# Patient Record
Sex: Female | Born: 1980 | Race: White | Marital: Married | Smoking: Never smoker
Health system: Southern US, Community
[De-identification: ages and names within clinical notes are randomized; demographics above are authoritative.]

## PROBLEM LIST (undated history)

## (undated) DIAGNOSIS — F53 Postpartum depression: Secondary | ICD-10-CM

## (undated) DIAGNOSIS — O99345 Other mental disorders complicating the puerperium: Secondary | ICD-10-CM

## (undated) HISTORY — PX: WISDOM TOOTH EXTRACTION: SHX21

## (undated) LAB — HM HPV COTESTING: HM HPV COTESTING: NEGATIVE

---

## 2013-02-08 DIAGNOSIS — O42919 Preterm premature rupture of membranes, unspecified as to length of time between rupture and onset of labor, unspecified trimester: Secondary | ICD-10-CM

## 2013-12-01 ENCOUNTER — Ambulatory Visit
Admission: RE | Admit: 2013-12-01 | Discharge: 2013-12-01 | Disposition: A | Discharge status: Home / Self Care | Payer: BC Managed Care – PPO | Source: Ambulatory Visit | Attending: Family Medicine | Admitting: Family Medicine

## 2013-12-01 ENCOUNTER — Other Ambulatory Visit: Payer: Self-pay | Admitting: Family Medicine

## 2013-12-01 DIAGNOSIS — K529 Noninfective gastroenteritis and colitis, unspecified: Secondary | ICD-10-CM

## 2014-04-26 LAB — OB RESULTS CONSOLE HEPATITIS B SURFACE ANTIGEN: Hepatitis B Surface Ag: NEGATIVE

## 2014-04-26 LAB — OB RESULTS CONSOLE HIV ANTIBODY (ROUTINE TESTING): HIV: NONREACTIVE

## 2014-04-26 LAB — OB RESULTS CONSOLE ABO/RH: RH Type: POSITIVE

## 2014-04-26 LAB — OB RESULTS CONSOLE RPR: RPR: NONREACTIVE

## 2014-04-26 LAB — OB RESULTS CONSOLE ANTIBODY SCREEN: Antibody Screen: NEGATIVE

## 2014-04-26 LAB — OB RESULTS CONSOLE RUBELLA ANTIBODY, IGM: Rubella: IMMUNE

## 2014-11-10 NOTE — L&D Delivery Note (Signed)
Delivery Note At  a viable female was delivered via spontaneous vaginal delivery (Presentation:LOA ;  ).  APGAR:5/8 , ; weight  .   Placenta status:intact , .3 vessel;  Cord:  with the following complications:none2 .  Cord pH: pending  Anesthesia:  epidural Episiotomy:  none Lacerations:  none Suture Repair: na Est. Blood Loss (mL):  300  Mom to postpartum.  Baby to Couplet care / Skin to Skin.  Kellsie Grindle S 12/05/2014, 2:38 PM

## 2014-11-29 ENCOUNTER — Inpatient Hospital Stay (HOSPITAL_COMMUNITY)
Admission: AD | Admit: 2014-11-29 | Discharge: 2014-11-29 | Disposition: A | Discharge status: Home / Self Care | Payer: BLUE CROSS/BLUE SHIELD | Source: Ambulatory Visit | Attending: Obstetrics and Gynecology | Admitting: Obstetrics and Gynecology

## 2014-11-29 ENCOUNTER — Encounter (HOSPITAL_COMMUNITY): Payer: Self-pay

## 2014-11-29 DIAGNOSIS — O471 False labor at or after 37 completed weeks of gestation: Secondary | ICD-10-CM | POA: Diagnosis present

## 2014-11-29 DIAGNOSIS — Z3A38 38 weeks gestation of pregnancy: Secondary | ICD-10-CM | POA: Insufficient documentation

## 2014-11-29 NOTE — MAU Note (Signed)
Ctx every 3-7 minutes tonight. Denies LOF or vaginal bleeding. Positive fetal movement. SVE 2.75cm/60 last week per Dr. Renaldo FiddlerAdkins.

## 2014-11-29 NOTE — MAU Note (Signed)
PT SAYS  HURTING  SINCE   6PM.  VE LAST WEEK     DR ADKINS -  2.75 CM      DENIES HSV AND MRSA.        GBS-  POSITIVE.

## 2014-11-30 ENCOUNTER — Inpatient Hospital Stay (HOSPITAL_COMMUNITY)
Admission: AD | Admit: 2014-11-30 | Payer: BLUE CROSS/BLUE SHIELD | Source: Ambulatory Visit | Admitting: Obstetrics and Gynecology

## 2014-12-04 ENCOUNTER — Telehealth (HOSPITAL_COMMUNITY): Payer: Self-pay | Admitting: *Deleted

## 2014-12-04 ENCOUNTER — Encounter (HOSPITAL_COMMUNITY): Payer: Self-pay | Admitting: *Deleted

## 2014-12-04 LAB — OB RESULTS CONSOLE GBS: STREP GROUP B AG: POSITIVE

## 2014-12-04 NOTE — Telephone Encounter (Signed)
Preadmission screen  

## 2014-12-05 ENCOUNTER — Encounter (HOSPITAL_COMMUNITY): Payer: Self-pay

## 2014-12-05 ENCOUNTER — Inpatient Hospital Stay (HOSPITAL_COMMUNITY)
Admission: RE | Admit: 2014-12-05 | Discharge: 2014-12-07 | DRG: 775 | Disposition: A | Discharge status: Home / Self Care | Payer: BLUE CROSS/BLUE SHIELD | Source: Ambulatory Visit | Attending: Obstetrics and Gynecology | Admitting: Obstetrics and Gynecology

## 2014-12-05 ENCOUNTER — Inpatient Hospital Stay (HOSPITAL_COMMUNITY): Payer: BLUE CROSS/BLUE SHIELD | Admitting: Anesthesiology

## 2014-12-05 DIAGNOSIS — Z3490 Encounter for supervision of normal pregnancy, unspecified, unspecified trimester: Secondary | ICD-10-CM

## 2014-12-05 DIAGNOSIS — O99824 Streptococcus B carrier state complicating childbirth: Secondary | ICD-10-CM | POA: Diagnosis present

## 2014-12-05 DIAGNOSIS — Z3A39 39 weeks gestation of pregnancy: Secondary | ICD-10-CM | POA: Diagnosis present

## 2014-12-05 DIAGNOSIS — Z3403 Encounter for supervision of normal first pregnancy, third trimester: Secondary | ICD-10-CM | POA: Diagnosis present

## 2014-12-05 HISTORY — DX: Other mental disorders complicating the puerperium: O99.345

## 2014-12-05 HISTORY — DX: Postpartum depression: F53.0

## 2014-12-05 LAB — CBC
HCT: 37.9 % (ref 36.0–46.0)
Hemoglobin: 13.3 g/dL (ref 12.0–15.0)
MCH: 31.8 pg (ref 26.0–34.0)
MCHC: 35.1 g/dL (ref 30.0–36.0)
MCV: 90.7 fL (ref 78.0–100.0)
Platelets: 299 10*3/uL (ref 150–400)
RBC: 4.18 MIL/uL (ref 3.87–5.11)
RDW: 13 % (ref 11.5–15.5)
WBC: 9.4 10*3/uL (ref 4.0–10.5)

## 2014-12-05 LAB — TYPE AND SCREEN
ABO/RH(D): O POS
ANTIBODY SCREEN: NEGATIVE

## 2014-12-05 LAB — ABO/RH: ABO/RH(D): O POS

## 2014-12-05 MED ORDER — TERBUTALINE SULFATE 1 MG/ML IJ SOLN
0.2500 mg | Freq: Once | INTRAMUSCULAR | Status: DC | PRN
  Filled 2014-12-05: qty 1

## 2014-12-05 MED ORDER — LANOLIN HYDROUS EX OINT: TOPICAL_OINTMENT | CUTANEOUS | Status: DC | PRN

## 2014-12-05 MED ORDER — LACTATED RINGERS IV SOLN
INTRAVENOUS | Status: DC
  Administered 2014-12-05 (×2): via INTRAVENOUS

## 2014-12-05 MED ORDER — OXYCODONE-ACETAMINOPHEN 5-325 MG PO TABS: 2.0000 | ORAL_TABLET | ORAL | Status: DC | PRN

## 2014-12-05 MED ORDER — FLEET ENEMA 7-19 GM/118ML RE ENEM: 1.0000 | ENEMA | RECTAL | Status: DC | PRN

## 2014-12-05 MED ORDER — PRENATAL MULTIVITAMIN CH
1.0000 | ORAL_TABLET | Freq: Every day | ORAL | Status: DC
Start: 2014-12-06 — End: 2014-12-07
  Administered 2014-12-06: 1 via ORAL
  Filled 2014-12-05: qty 1

## 2014-12-05 MED ORDER — OXYCODONE-ACETAMINOPHEN 5-325 MG PO TABS: 1.0000 | ORAL_TABLET | ORAL | Status: DC | PRN

## 2014-12-05 MED ORDER — FENTANYL 2.5 MCG/ML BUPIVACAINE 1/10 % EPIDURAL INFUSION (WH - ANES)
INTRAMUSCULAR | Status: DC | PRN
  Administered 2014-12-05: 14 mL/h via EPIDURAL

## 2014-12-05 MED ORDER — IBUPROFEN 600 MG PO TABS
600.0000 mg | ORAL_TABLET | Freq: Four times a day (QID) | ORAL | Status: DC
  Administered 2014-12-05 – 2014-12-07 (×7): 600 mg via ORAL
  Filled 2014-12-05 (×7): qty 1

## 2014-12-05 MED ORDER — DIPHENHYDRAMINE HCL 50 MG/ML IJ SOLN: 12.5000 mg | INTRAMUSCULAR | Status: DC | PRN

## 2014-12-05 MED ORDER — OXYTOCIN 40 UNITS IN LACTATED RINGERS INFUSION - SIMPLE MED
62.5000 mL/h | INTRAVENOUS | Status: DC
  Filled 2014-12-05: qty 1000

## 2014-12-05 MED ORDER — SIMETHICONE 80 MG PO CHEW: 80.0000 mg | CHEWABLE_TABLET | ORAL | Status: DC | PRN

## 2014-12-05 MED ORDER — SODIUM CHLORIDE 0.9 % IV SOLN
2.0000 g | Freq: Four times a day (QID) | INTRAVENOUS | Status: DC
  Administered 2014-12-05: 2 g via INTRAVENOUS
  Filled 2014-12-05 (×3): qty 2000

## 2014-12-05 MED ORDER — BENZOCAINE-MENTHOL 20-0.5 % EX AERO: 1.0000 "application " | INHALATION_SPRAY | CUTANEOUS | Status: DC | PRN

## 2014-12-05 MED ORDER — WITCH HAZEL-GLYCERIN EX PADS: 1.0000 "application " | MEDICATED_PAD | CUTANEOUS | Status: DC | PRN

## 2014-12-05 MED ORDER — EPHEDRINE 5 MG/ML INJ
10.0000 mg | INTRAVENOUS | Status: DC | PRN
  Filled 2014-12-05: qty 2

## 2014-12-05 MED ORDER — LACTATED RINGERS IV SOLN: 500.0000 mL | INTRAVENOUS | Status: DC | PRN

## 2014-12-05 MED ORDER — ACETAMINOPHEN 325 MG PO TABS: 650.0000 mg | ORAL_TABLET | ORAL | Status: DC | PRN

## 2014-12-05 MED ORDER — DIPHENHYDRAMINE HCL 25 MG PO CAPS: 25.0000 mg | ORAL_CAPSULE | Freq: Four times a day (QID) | ORAL | Status: DC | PRN

## 2014-12-05 MED ORDER — LACTATED RINGERS IV SOLN
500.0000 mL | Freq: Once | INTRAVENOUS | Status: AC
  Administered 2014-12-05: 500 mL via INTRAVENOUS

## 2014-12-05 MED ORDER — ONDANSETRON HCL 4 MG/2ML IJ SOLN: 4.0000 mg | Freq: Four times a day (QID) | INTRAMUSCULAR | Status: DC | PRN

## 2014-12-05 MED ORDER — LIDOCAINE HCL (PF) 1 % IJ SOLN
30.0000 mL | INTRAMUSCULAR | Status: DC | PRN
  Filled 2014-12-05: qty 30

## 2014-12-05 MED ORDER — FENTANYL 2.5 MCG/ML BUPIVACAINE 1/10 % EPIDURAL INFUSION (WH - ANES)
14.0000 mL/h | INTRAMUSCULAR | Status: DC | PRN
  Administered 2014-12-05: 14 mL/h via EPIDURAL
  Filled 2014-12-05: qty 125

## 2014-12-05 MED ORDER — ZOLPIDEM TARTRATE 5 MG PO TABS: 5.0000 mg | ORAL_TABLET | Freq: Every evening | ORAL | Status: DC | PRN

## 2014-12-05 MED ORDER — OXYTOCIN 40 UNITS IN LACTATED RINGERS INFUSION - SIMPLE MED
1.0000 m[IU]/min | INTRAVENOUS | Status: DC
  Administered 2014-12-05: 2 m[IU]/min via INTRAVENOUS

## 2014-12-05 MED ORDER — LIDOCAINE HCL (PF) 1 % IJ SOLN
INTRAMUSCULAR | Status: DC | PRN
  Administered 2014-12-05 (×2): 4 mL

## 2014-12-05 MED ORDER — OXYCODONE-ACETAMINOPHEN 5-325 MG PO TABS
1.0000 | ORAL_TABLET | ORAL | Status: DC | PRN
  Administered 2014-12-06: 1 via ORAL
  Filled 2014-12-05: qty 1

## 2014-12-05 MED ORDER — PHENYLEPHRINE 40 MCG/ML (10ML) SYRINGE FOR IV PUSH (FOR BLOOD PRESSURE SUPPORT)
80.0000 ug | PREFILLED_SYRINGE | INTRAVENOUS | Status: DC | PRN
  Filled 2014-12-05: qty 2
  Filled 2014-12-05: qty 20

## 2014-12-05 MED ORDER — ONDANSETRON HCL 4 MG/2ML IJ SOLN: 4.0000 mg | INTRAMUSCULAR | Status: DC | PRN

## 2014-12-05 MED ORDER — SENNOSIDES-DOCUSATE SODIUM 8.6-50 MG PO TABS
2.0000 | ORAL_TABLET | ORAL | Status: DC
Start: 2014-12-06 — End: 2014-12-07
  Administered 2014-12-06: 2 via ORAL
  Filled 2014-12-05 (×2): qty 2

## 2014-12-05 MED ORDER — BISACODYL 10 MG RE SUPP: 10.0000 mg | Freq: Every day | RECTAL | Status: DC | PRN

## 2014-12-05 MED ORDER — ONDANSETRON HCL 4 MG PO TABS: 4.0000 mg | ORAL_TABLET | ORAL | Status: DC | PRN

## 2014-12-05 MED ORDER — OXYTOCIN BOLUS FROM INFUSION
500.0000 mL | INTRAVENOUS | Status: DC
  Administered 2014-12-05: 500 mL via INTRAVENOUS

## 2014-12-05 MED ORDER — PHENYLEPHRINE 40 MCG/ML (10ML) SYRINGE FOR IV PUSH (FOR BLOOD PRESSURE SUPPORT)
80.0000 ug | PREFILLED_SYRINGE | INTRAVENOUS | Status: DC | PRN
  Filled 2014-12-05: qty 2

## 2014-12-05 MED ORDER — SODIUM CHLORIDE 0.9 % IV SOLN
2.0000 g | Freq: Once | INTRAVENOUS | Status: AC
  Administered 2014-12-05: 2 g via INTRAVENOUS
  Filled 2014-12-05: qty 2000

## 2014-12-05 MED ORDER — TETANUS-DIPHTH-ACELL PERTUSSIS 5-2.5-18.5 LF-MCG/0.5 IM SUSP
0.5000 mL | Freq: Once | INTRAMUSCULAR | Status: AC
  Administered 2014-12-07: 0.5 mL via INTRAMUSCULAR
  Filled 2014-12-05: qty 0.5

## 2014-12-05 MED ORDER — CITRIC ACID-SODIUM CITRATE 334-500 MG/5ML PO SOLN: 30.0000 mL | ORAL | Status: DC | PRN

## 2014-12-05 MED ORDER — DIBUCAINE 1 % RE OINT: 1.0000 "application " | TOPICAL_OINTMENT | RECTAL | Status: DC | PRN

## 2014-12-05 MED ORDER — FLEET ENEMA 7-19 GM/118ML RE ENEM: 1.0000 | ENEMA | Freq: Every day | RECTAL | Status: DC | PRN

## 2014-12-05 NOTE — Lactation Note (Signed)
This note was copied from the chart of [name]. Lactation Consultation Note  Patient Name: [name] BJYNW'GToday's Date: 12/05/2014 Reason for consult: Initial assessment of this mom and baby at 8 hours pp.  This is mom's second child.  Her first child was born at 2436 weeks and weight >6 lbs but breastfeeding was a challenge in the beginning, per mom.  She was able to breastfeed her for 14 months and states that her newborn has latched well to both breasts.  Mom encouraged to place baby STS frequently and cue feed.  LC reviewed reason for hand expression and mom says she knows how to express by hand as needed.  Baby is not cuing and is asleep and STS after first bath.  Initial LATCH score=9 per RN assessment.LC encouraged review of Baby and Me pp 9, 14 and 20-25 for STS and BF information. LC provided [facility] brochure and reviewed [facility] services and list of community and web site resources.     Maternal Data Formula Feeding for Exclusion: No Has patient been taught Hand Expression?: Yes Does the patient have breastfeeding experience prior to this delivery?: Yes  Feeding Feeding Type: Breast Fed Length of feed: 5 min  LATCH Score/Interventions Latch: Grasps breast easily, tongue down, lips flanged, rhythmical sucking.  Audible Swallowing: A few with stimulation Intervention(s): Skin to skin  Type of Nipple: Everted at rest and after stimulation  Comfort (Breast/Nipple): Soft / non-tender     Hold (Positioning): No assistance needed to correctly position infant at breast.  LATCH Score: 9 (previous feeding assessment, per RN)  Lactation Tools Discussed/Used   STS, hand expression, cue feedings  Consult Status Consult Status: Follow-up Date: 12/06/14 Follow-up type: In-patient    [name], [name] [facility] 12/05/2014, 10:32 PM

## 2014-12-05 NOTE — Anesthesia Preprocedure Evaluation (Signed)
Anesthesia Evaluation  Patient identified by MRN, date of birth, ID band Patient awake    Reviewed: Allergy & Precautions, NPO status , Patient's Chart, lab work & pertinent test results  History of Anesthesia Complications Negative for: history of anesthetic complications  Airway Mallampati: II  TM Distance: >3 FB Neck ROM: Full    Dental no notable dental hx. (+) Dental Advisory Given   Pulmonary neg pulmonary ROS,  breath sounds clear to auscultation  Pulmonary exam normal       Cardiovascular negative cardio ROS  Rhythm:Regular Rate:Normal     Neuro/Psych PSYCHIATRIC DISORDERS Depression negative neurological ROS     GI/Hepatic negative GI ROS, Neg liver ROS,   Endo/Other  negative endocrine ROS  Renal/GU negative Renal ROS  negative genitourinary   Musculoskeletal negative musculoskeletal ROS (+)   Abdominal   Peds negative pediatric ROS (+)  Hematology negative hematology ROS (+)   Anesthesia Other Findings   Reproductive/Obstetrics (+) Pregnancy                             Anesthesia Physical Anesthesia Plan  ASA: II  Anesthesia Plan: Epidural   Post-op Pain Management:    Induction:   Airway Management Planned:   Additional Equipment:   Intra-op Plan:   Post-operative Plan:   Informed Consent: I have reviewed the patients History and Physical, chart, labs and discussed the procedure including the risks, benefits and alternatives for the proposed anesthesia with the patient or authorized representative who has indicated his/her understanding and acceptance.   Dental advisory given  Plan Discussed with:   Anesthesia Plan Comments:         Anesthesia Quick Evaluation

## 2014-12-05 NOTE — Progress Notes (Signed)
Infant delivered without complication at 1426.  When infant did not begin to cry with stimulation per md, cord was cut and baby taken to warmer.  Infant rr and hr wnl and color was improving but poor tone continued.  Pulse ox applied at approx 4min of age and o2 was 75%. Oxygenation improved quickly and by 8min of age, 62o2 was 94% Infant given back to mom.  Within 5min of being skin to skin infant began to grunt and dropped his sats back into the 70s.  Infant taken back to the warmer and nursery called to come and assess.

## 2014-12-05 NOTE — Anesthesia Procedure Notes (Signed)
Epidural Patient location during procedure: OB Start time: 12/05/2014 10:20 AM End time: 12/05/2014 10:26 AM  Staffing Anesthesiologist: Felipe DroneJUDD, Jaidev Sanger JENNETTE Performed by: anesthesiologist   Preanesthetic Checklist Completed: patient identified, site marked, surgical consent, pre-op evaluation, timeout performed, IV checked, risks and benefits discussed and monitors and equipment checked  Epidural Patient position: sitting Prep: site prepped and draped and DuraPrep Patient monitoring: continuous pulse ox and blood pressure Approach: midline Location: L3-L4 Injection technique: LOR saline  Needle:  Needle type: Tuohy  Needle gauge: 17 G Needle length: 9 cm and 9 Needle insertion depth: 5 cm cm Catheter type: closed end flexible Catheter size: 19 Gauge Catheter at skin depth: 10 cm Test dose: negative  Assessment Events: blood not aspirated, injection not painful, no injection resistance, negative IV test and no paresthesia  Additional Notes Patient identified. Risks/Benefits/Options discussed with patient including but not limited to bleeding, infection, nerve damage, paralysis, failed block, incomplete pain control, headache, blood pressure changes, nausea, vomiting, reactions to medication both or allergic, itching and postpartum back pain. Confirmed with bedside nurse the patient's most recent platelet count. Confirmed with patient that they are not currently taking any anticoagulation, have any bleeding history or any family history of bleeding disorders. Patient expressed understanding and wished to proceed. All questions were answered. Sterile technique was used throughout the entire procedure. Please see nursing notes for vital signs. Test dose was given through epidural catheter and negative prior to continuing to dose epidural or start infusion. Warning signs of high block given to the patient including shortness of breath, tingling/numbness in hands, complete motor block, or  any concerning symptoms with instructions to call for help. Patient was given instructions on fall risk and not to get out of bed. All questions and concerns addressed with instructions to call with any issues or inadequate analgesia.

## 2014-12-05 NOTE — H&P (Signed)
Morgan LabradorMolly Welch is a 34 y.o. female presenting at 5939 weeks with favorable cervix for induction by AROM.  Positive GBS. Maternal Medical History:  Fetal activity: Perceived fetal activity is normal.    Prenatal complications: no prenatal complications Prenatal Complications - Diabetes: none.    OB History    Gravida Para Term Preterm AB TAB SAB Ectopic Multiple Living   2 1  1      1      No past medical history on file. Past Surgical History  Procedure Laterality Date  . Wisdom tooth extraction     Family History: family history is not on file. Social History:  reports that she has never smoked. She does not have any smokeless tobacco history on file. She reports that she does not drink alcohol or use illicit drugs.   Prenatal Transfer Tool  Maternal Diabetes: No Genetic Screening: Declined Maternal Ultrasounds/Referrals: Normal Fetal Ultrasounds or other Referrals:  None Maternal Substance Abuse:  No Significant Maternal Medications:  None Significant Maternal Lab Results:  Lab values include: Group B Strep positive Other Comments:  None  ROS    Height 5\' 2"  (1.575 m), weight 83.008 kg (183 lb), not currently breastfeeding. Maternal Exam:  Uterine Assessment: Contraction strength is mild.  Contraction frequency is irregular.   Abdomen: Patient reports no abdominal tenderness. Fundal height is c/w dates.   Estimated fetal weight is 8.   Fetal presentation: vertex  Pelvis: adequate for delivery.   Cervix: 3 cm 50 %  Fetal Exam Fetal State Assessment: Category I - tracings are normal.     Physical Exam  Prenatal labs: ABO, Rh: O/Positive/-- (06/17 0000) Antibody: Negative (06/17 0000) Rubella: Immune (06/17 0000) RPR: Nonreactive (06/17 0000)  HBsAg: Negative (06/17 0000)  HIV: Non-reactive (06/17 0000)  GBS: Positive (01/25 0000)   Assessment/Plan: IUP at 39 weeks with favorable cervix for induction Positive GBS IV ampicillin induction by  AROM   Morgan Welch S 12/05/2014, 7:53 AM

## 2014-12-06 LAB — CBC
HCT: 33.6 % — ABNORMAL LOW (ref 36.0–46.0)
Hemoglobin: 11.5 g/dL — ABNORMAL LOW (ref 12.0–15.0)
MCH: 31.3 pg (ref 26.0–34.0)
MCHC: 34.2 g/dL (ref 30.0–36.0)
MCV: 91.3 fL (ref 78.0–100.0)
PLATELETS: 234 10*3/uL (ref 150–400)
RBC: 3.68 MIL/uL — ABNORMAL LOW (ref 3.87–5.11)
RDW: 13 % (ref 11.5–15.5)
WBC: 12.2 10*3/uL — ABNORMAL HIGH (ref 4.0–10.5)

## 2014-12-06 LAB — RPR: RPR Ser Ql: NONREACTIVE

## 2014-12-06 NOTE — Progress Notes (Signed)
Post Partum Day 1 Subjective: no complaints, up ad lib, voiding, tolerating PO and + flatus  Objective: Blood pressure 109/64, pulse 66, temperature 98 F (36.7 C), temperature source Oral, resp. rate 20, height 5\' 2"  (1.575 m), weight 183 lb (83.008 kg), SpO2 98 %, unknown if currently breastfeeding.  Physical Exam:  General: alert and cooperative Lochia: appropriate Uterine Fundus: firm Incision: perineum intact DVT Evaluation: No evidence of DVT seen on physical exam. Negative Homan's sign. No cords or calf tenderness. No significant calf/ankle edema.   Recent Labs  12/05/14 0755 12/06/14 0540  HGB 13.3 11.5*  HCT 37.9 33.6*    Assessment/Plan: Plan for discharge tomorrow and Circumcision prior to discharge   LOS: 1 day   Nitin Mckowen G 12/06/2014, 8:18 AM

## 2014-12-06 NOTE — Anesthesia Postprocedure Evaluation (Signed)
Anesthesia Post Note  Patient: Morgan LabradorMolly Klostermann  Procedure(s) Performed: * No procedures listed *  Anesthesia type: Epidural  Patient location: Mother/Baby  Post pain: Pain level controlled  Post assessment: Post-op Vital signs reviewed  Last Vitals:  Filed Vitals:   12/06/14 0629  BP: 109/64  Pulse: 66  Temp: 36.7 C  Resp:     Post vital signs: Reviewed  Level of consciousness:alert  Complications: No apparent anesthesia complications

## 2014-12-06 NOTE — Anesthesia Postprocedure Evaluation (Signed)
  Anesthesia Post-op Note  Patient: Morgan LabradorMolly Shaheen  Procedure(s) Performed: * No procedures listed *  Patient Location: Mother/Baby  Anesthesia Type:Epidural  Level of Consciousness: awake, alert  and oriented  Airway and Oxygen Therapy: Patient Spontanous Breathing  Post-op Pain: none  Post-op Assessment: Post-op Vital signs reviewed and Patient's Cardiovascular Status Stable  Post-op Vital Signs: Reviewed and stable  Last Vitals:  Filed Vitals:   12/06/14 0629  BP: 109/64  Pulse: 66  Temp: 36.7 C  Resp:     Complications: No apparent anesthesia complications

## 2014-12-07 MED ORDER — IBUPROFEN 600 MG PO TABS: 600.0000 mg | ORAL_TABLET | Freq: Four times a day (QID) | ORAL | Status: AC

## 2014-12-07 MED ORDER — OXYCODONE-ACETAMINOPHEN 5-325 MG PO TABS: 1.0000 | ORAL_TABLET | ORAL | Status: AC | PRN

## 2014-12-07 NOTE — Discharge Summary (Signed)
Obstetric Discharge Summary Reason for Admission: induction of labor Prenatal Procedures: ultrasound Intrapartum Procedures: spontaneous vaginal delivery Postpartum Procedures: none Complications-Operative and Postpartum: none HEMOGLOBIN  Date Value Ref Range Status  12/06/2014 11.5* 12.0 - 15.0 g/dL Final   HCT  Date Value Ref Range Status  12/06/2014 33.6* 36.0 - 46.0 % Final    Physical Exam:  General: alert and cooperative Lochia: appropriate Uterine Fundus: firm Incision: perineum intact DVT Evaluation: No evidence of DVT seen on physical exam. Negative Homan's sign. No cords or calf tenderness. No significant calf/ankle edema.  Discharge Diagnoses: Term Pregnancy-delivered  Discharge Information: Date: 12/07/2014 Activity: pelvic rest Diet: routine Medications: PNV, Ibuprofen and Percocet Condition: stable Instructions: refer to practice specific booklet Discharge to: home   Newborn Data: Live born female  Birth Weight: 8 lb 7.3 oz (3835 g) APGAR: 5, 8  Home with mother.  Morgan Welch G 12/07/2014, 8:24 AM

## 2014-12-07 NOTE — Lactation Note (Signed)
This note was copied from the chart of Morgan Illene LabradorMolly Jaworowski. Lactation Consultation Note: Follow up visit with mom. She reports that baby just finished feeding- he is asleep in her arms. Reports this breast feeding experience is so much different than her first. Pecola LeisureBaby is latching well with no pain. Asking about when to start pumping- reviewed with mom. No further questions at present. To call prn  Patient Name: Morgan Welch FAOZH'YToday's Date: 12/07/2014 Reason for consult: Follow-up assessment   Maternal Data Formula Feeding for Exclusion: No Has patient been taught Hand Expression?: Yes Does the patient have breastfeeding experience prior to this delivery?: Yes  Feeding    LATCH Score/Interventions                      Lactation Tools Discussed/Used     Consult Status Consult Status: Complete    Pamelia HoitWeeks, Miles Borkowski D 12/07/2014, 8:27 AM

## 2015-02-18 IMAGING — US US ABDOMEN COMPLETE
1 series · 14 of 25 positions shown · non-contrast
Comparison: None.

CLINICAL DATA: Chronic diarrhea

EXAM:
ULTRASOUND ABDOMEN COMPLETE

[Series 1: us abdomen complete · 0.24mm/px · 14 of 63 slices shown]
[im 1/63]
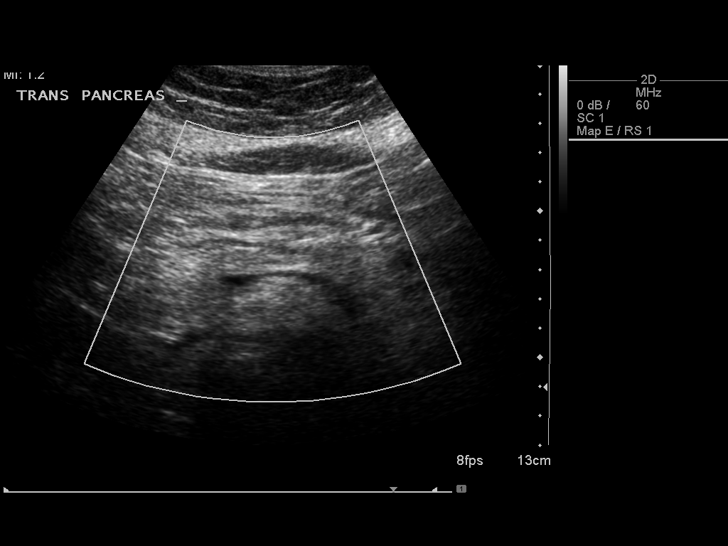
[im 6/63]
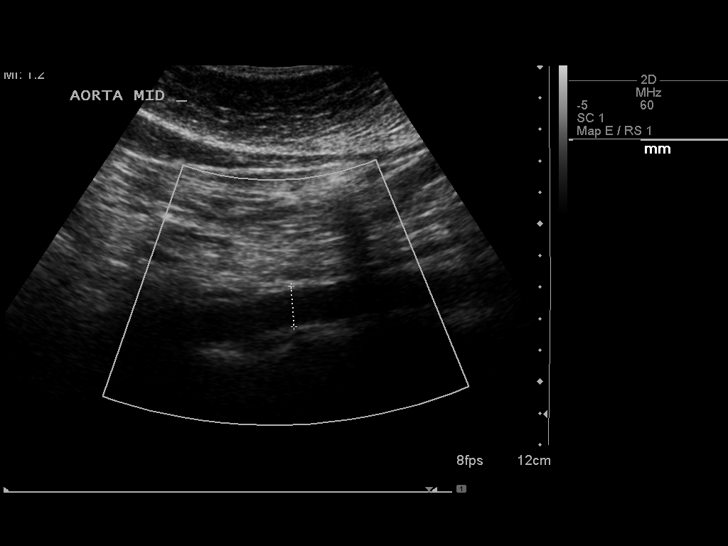
[im 11/63]
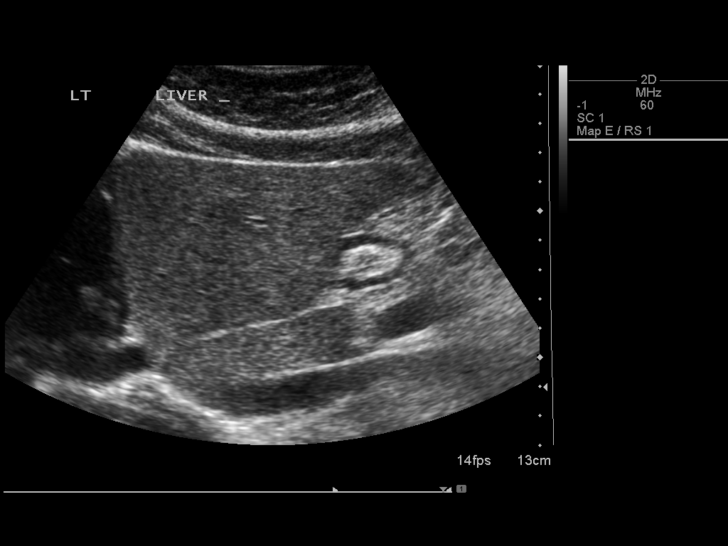
[im 16/63]
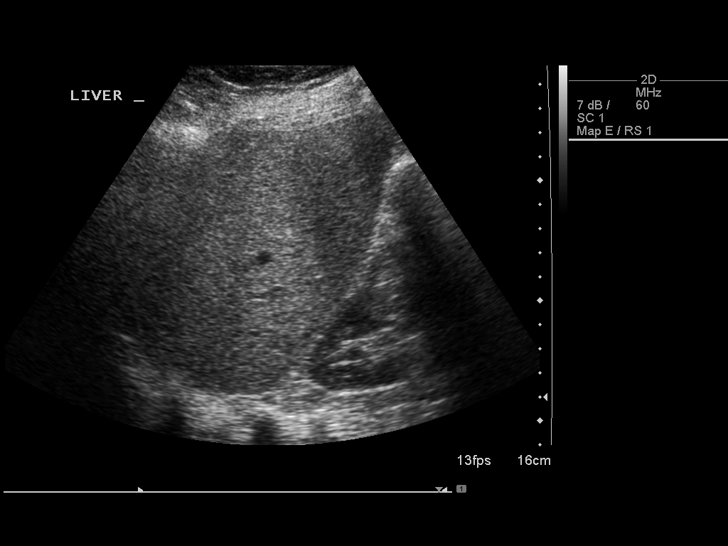
[im 21/63]
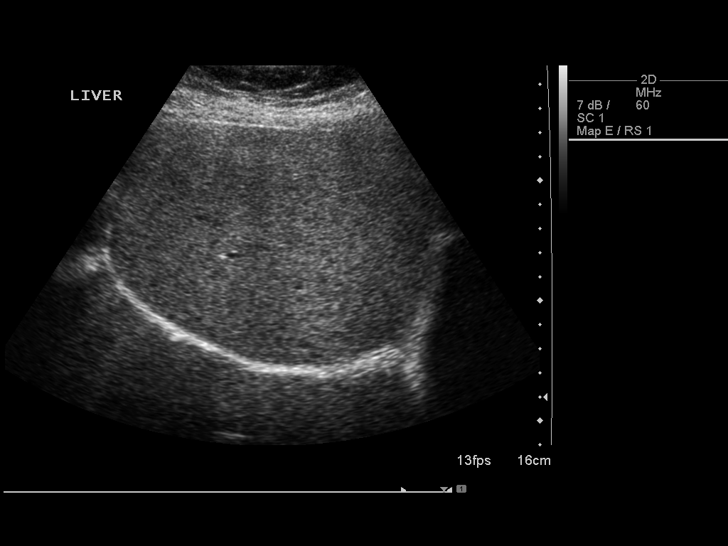
[im 24/63]
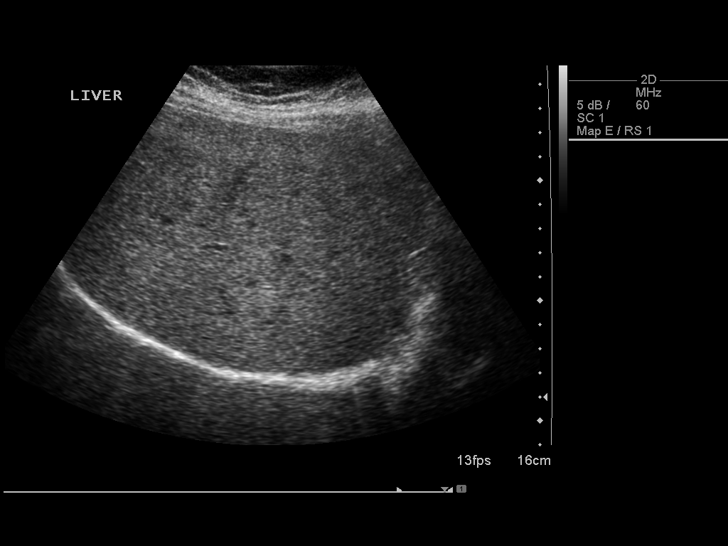
[im 29/63]
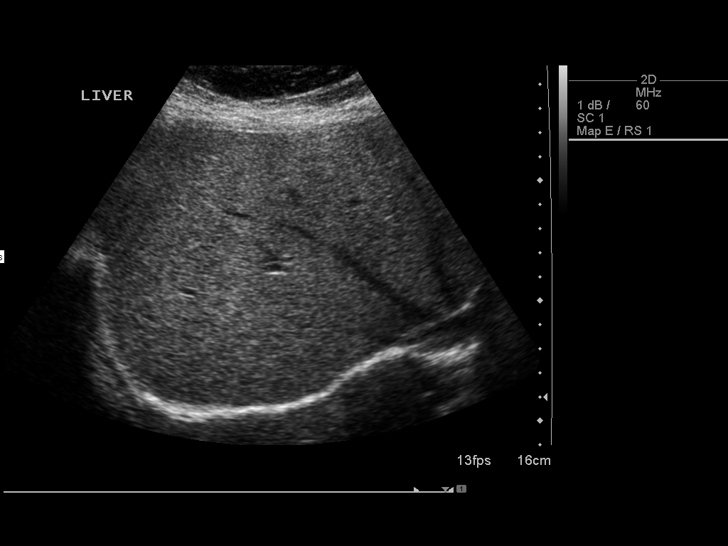
[im 34/63]
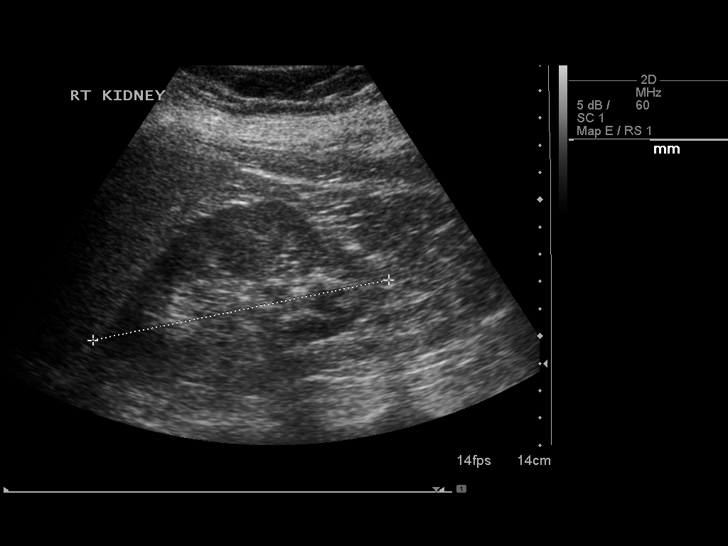
[im 39/63]
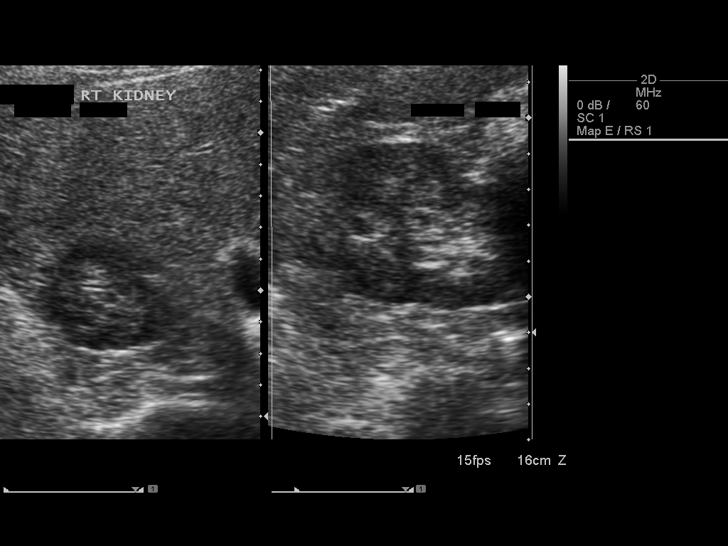
[im 42/63]
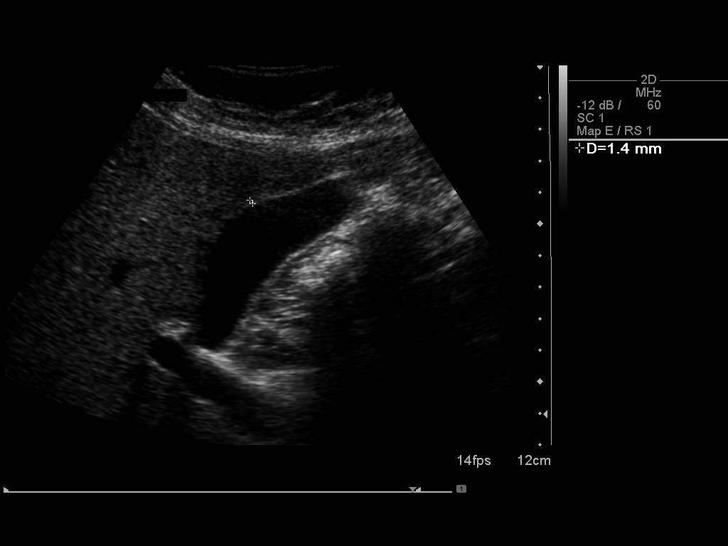
[im 47/63]
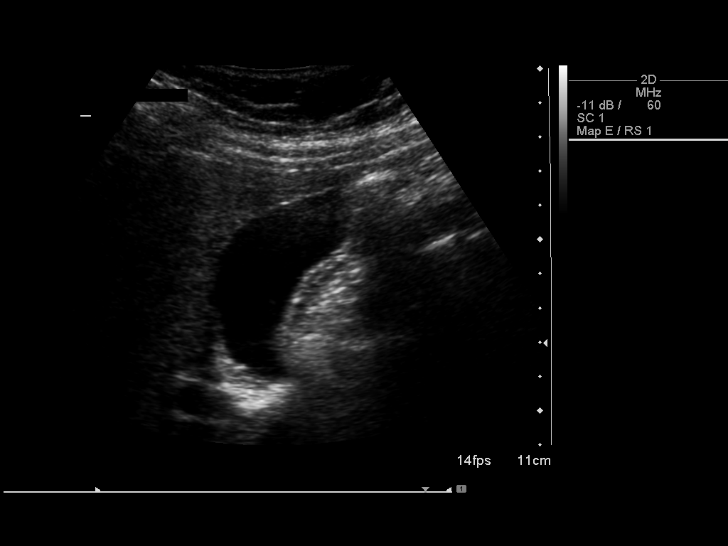
[im 52/63]
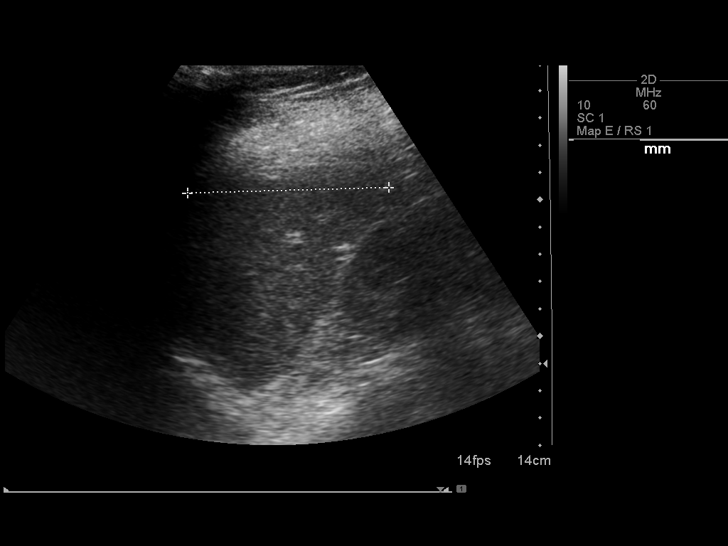
[im 57/63]
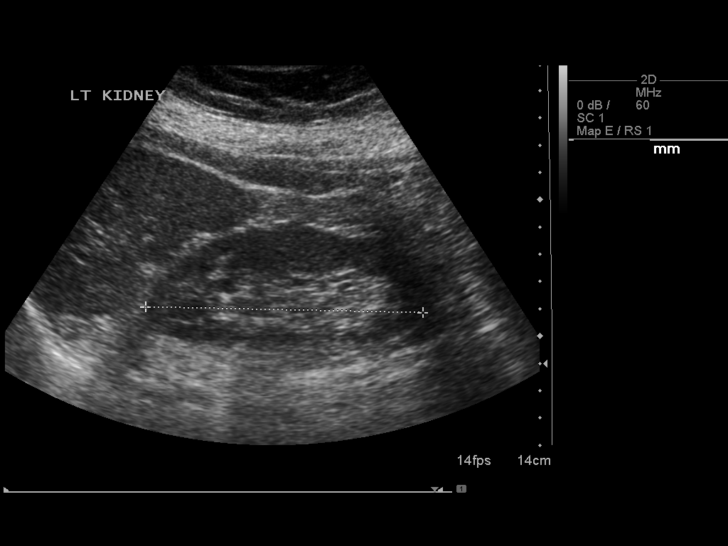
[im 63/63]
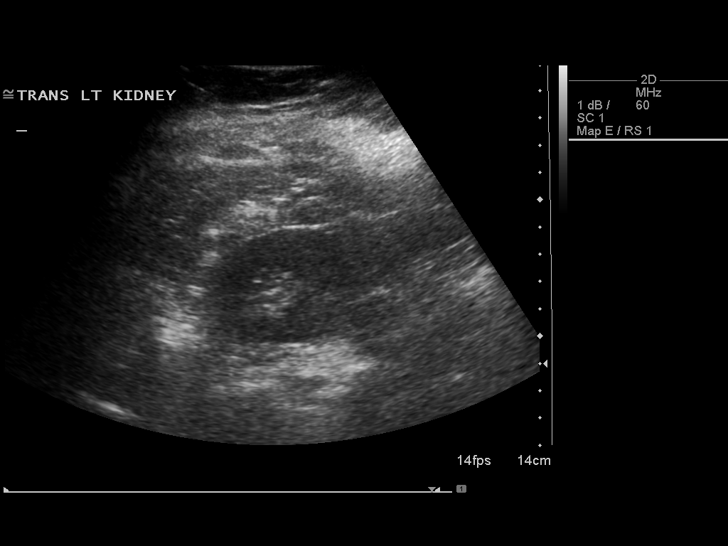

[14 of 25 positions shown; findings below may reference images not displayed]

FINDINGS: Gallbladder:

No gallstones, gallbladder wall thickening, or pericholecystic
fluid. Negative sonographic Murphy's sign.

Common bile duct:

Diameter: 3 mm.

Liver:

No focal lesion identified. Within the upper limits of normal for
parenchymal echogenicity.

IVC:

No abnormality visualized.

Pancreas:

Visualized portions are unremarkable.

Spleen:

Measures 7.4 cm.

Right Kidney:

Length: 11.1 cm.  No mass or hydronephrosis.

Left Kidney:

Length: 10.2 cm.  No mass or hydronephrosis.

Abdominal aorta:

No aneurysm visualized.

Other findings:

None.
IMPRESSION: Negative abdominal ultrasound.

## 2022-08-22 NOTE — Unmapped (Signed)
Ok to offer a new patient appointment (30 minutes) when there are available spots

## 2022-08-22 NOTE — Unmapped (Signed)
Pt requesting to see dr Christella Hartigan per referral of a friend that sees dr Christella Hartigan ([email or web address]). Informed of policy, insist on sending msg. Please advise

## 2022-08-25 NOTE — Unmapped (Signed)
Left voicemail for pt to call and schedule appointment

## 2022-08-26 NOTE — Unmapped (Signed)
Made apt for pt on nov 7th.  As a new pt per dr Christella Hartigan

## 2022-09-16 ENCOUNTER — Ambulatory Visit: Admit: 2022-09-16 | Discharge: 2022-09-16 | Payer: PRIVATE HEALTH INSURANCE

## 2022-09-16 DIAGNOSIS — Z0000 Encounter for general adult medical examination without abnormal findings: Secondary | ICD-10-CM

## 2022-09-16 MED ORDER — linaCLOtide (LINZESS) 145 mcg Cap
145 | ORAL_CAPSULE | Freq: Every day | ORAL | 5 refills | 30.00000 days | Status: AC
Start: 2022-09-16 — End: 2022-10-27

## 2022-09-16 NOTE — Unmapped (Signed)
Worse since cholecystectomy  Will trial linzess as pt has trialed many OTC meds

## 2022-09-16 NOTE — Unmapped (Signed)
[facility] OFFICE  [address]  [place] [address]  Subjective   Name:  Jasmine Fritz Date of Birth: Apr 29, 1981 (41 y.o.)   MRN: [id number]    Date of Service:  09/16/2022      Chief Complaint   Patient presents with    New Patient Visit/ Consultation     History of Present Illness:  Jasmine Fritz is a(n) 41 y.o. female here today for the following:   HPI    She is concerned her digestive system is not working well  Had her gallbladder removed in July 2022.  Since then she doesn't go to the restroom regularly, tried culturelle which worked for a little while but then it stopped working, tried Librarian, academic and another thing through [place]  She is also gaining a lot of weight.     Just moved here from [place]-   She is working at [employer]. Works as an Animator.   Things are worse, doesn't go regularly, when she does go it is kind of liquidy.     She is eating a healthy balance of foods.   Ground [place], steamed veggies, lots of fiber.   Drinks a lot of water.   She is exercising regularly, walks a lot , started some strength training to build up her muscle.     Had a hemithyroidectomy in 2019- gets her thyroid checked yearly due to this, was supposed to get it checked in July.     She had a PAP smear.     Well adult form completed regarding diet, exercise, safety, violence, sexual health and risk factors, health maintenance including breast health, pap smear, colonoscopy, dental, vision and hearing.   Reviewed with patient in detail during visit.   Alcohol misuse screening: performed, negative  Depression screening: performed, negative    Current Outpatient Medications   Medication Sig Dispense Refill    linaCLOtide (LINZESS) 145 mcg Cap Take 1 capsule (145 mcg total) by mouth daily. 30 capsule 5     No current facility-administered medications for this visit.      Review of Systems        Objective   Vitals:    09/16/22 1531   BP: 101/64    Pulse: 77   SpO2: 96%   Height: 5' 3 (1.6 m)   Weight: 156 lb 3.2 oz (70.9 kg)   BMI (Calculated): 27.68     Body mass index is 27.67 kg/m.        Physical Exam  Vitals and nursing note reviewed.   Constitutional:       General: She is not in acute distress.     Appearance: She is well-developed.   HENT:      Head: Normocephalic and atraumatic.      Right Ear: External ear normal.      Left Ear: External ear normal.      Nose: Nose normal.   Eyes:      General: No scleral icterus.     Conjunctiva/sclera: Conjunctivae normal.      Pupils: Pupils are equal, round, and reactive to light.   Neck:      Thyroid: No thyromegaly.   Cardiovascular:      Rate and Rhythm: Normal rate and regular rhythm.      Heart sounds: Normal heart sounds.   Pulmonary:      Effort: Pulmonary effort is normal. No respiratory distress.  Breath sounds: Normal breath sounds.   Abdominal:      General: Bowel sounds are normal. There is no distension.      Palpations: Abdomen is soft. There is no mass.      Tenderness: There is no abdominal tenderness.   Musculoskeletal:      Cervical back: Normal range of motion and neck supple.      Right lower leg: No edema.      Left lower leg: No edema.   Lymphadenopathy:      Cervical: No cervical adenopathy.   Skin:     General: Skin is warm and dry.      Capillary Refill: Capillary refill takes less than 2 seconds.      Findings: No rash.   Neurological:      Mental Status: She is alert and oriented to person, place, and time.      Motor: No abnormal muscle tone.   Psychiatric:         Behavior: Behavior normal.         Thought Content: Thought content normal.         Judgment: Judgment normal.                Assessment & Plan     Jasmine Fritz was seen today for new patient visit/ consultation.    Diagnoses and all orders for this visit:    Chronic constipation (Primary)  Assessment & Plan:  Worse since cholecystectomy  Will trial linzess as pt has trialed many OTC meds    Orders:  -     linaCLOtide  (LINZESS) 145 mcg Cap; Take 1 capsule (145 mcg total) by mouth daily.    History of melanoma  -     Dermatology    Wellness examination  Assessment & Plan:  Health Maintenance Summary       Overdue - Hepatitis C Screening (MyChart) (Once) Overdue - never done      No completion, postpone, frequency change, or communication history exists for this topic.              Overdue - Immunization: Hepatitis B (1 of 3 - 3-dose series) Overdue - never done      No completion, postpone, frequency change, or communication history exists for this topic.              Overdue - Comprehensive Physical Exam (Yearly) Overdue - never done      No completion, postpone, frequency change, or communication history exists for this topic.              Overdue - HIV Screening (Once) Overdue - never done      No completion, postpone, frequency change, or communication history exists for this topic.              Overdue - Cervical Cancer Screening/Pap Smear (MyChart) (Every 3 Years) Overdue - never done      No completion, postpone, frequency change, or communication history exists for this topic.              Overdue - Immunization: Influenza (MyChart) (1) Overdue since 07/11/2022      08/17/2020  Imm Admin: Influenza, unspecified    08/12/2019  Imm Admin: Influenza, quadrivalent, preservative-free    08/20/2018  Imm Admin: Influenza, quadrivalent, preservative-free    07/29/2017  Imm Admin: Influenza, unspecified              Depression Screening (Yearly) Next due on 09/17/2023  09/16/2022  SmartData: DEPRESSION SCREENING              Immunization: DTaP/Tdap/Td (3 - Td or Tdap) Next due on 07/10/2026      07/10/2016  Imm Admin: tdap    08/17/2013  Imm Admin: tdap              Immunization: Pneumococcal (Series Information) Aged Out      No completion, postpone, frequency change, or communication history exists for this topic.                  UTD pap per pt .   Obtain records.  Encouraged continued healthy lifestyle efforts  Check labs as  ordered  Will get flu vaccine at a later time, it usually causes SE    Orders:  -     Lipid Profile; Future  -     Hemoglobin A1c; Future  -     TSH (Thyroid Stimulating Hormone); Future  -     T4, free; Future  -     Comprehensive metabolic panel; Future  -     CBC; Future  -     Differential; Future    History of partial thyroidectomy  Assessment & Plan:  Check TFTs            BMI is above normal. A follow-up plan for weight management was discussed with patient.         Return in about 6 weeks (around 10/28/2022) for follow up.         Jasmine Fors Salley Slaughter, MD

## 2022-09-16 NOTE — Unmapped (Signed)
Check TFTs

## 2022-09-16 NOTE — Unmapped (Signed)
Health Maintenance Summary       Overdue - Hepatitis C Screening (MyChart) (Once) Overdue - never done      No completion, postpone, frequency change, or communication history exists for this topic.              Overdue - Immunization: Hepatitis B (1 of 3 - 3-dose series) Overdue - never done      No completion, postpone, frequency change, or communication history exists for this topic.              Overdue - Comprehensive Physical Exam (Yearly) Overdue - never done      No completion, postpone, frequency change, or communication history exists for this topic.              Overdue - HIV Screening (Once) Overdue - never done      No completion, postpone, frequency change, or communication history exists for this topic.              Overdue - Cervical Cancer Screening/Pap Smear (MyChart) (Every 3 Years) Overdue - never done      No completion, postpone, frequency change, or communication history exists for this topic.              Overdue - Immunization: Influenza (MyChart) (1) Overdue since 07/11/2022      08/17/2020  Imm Admin: Influenza, unspecified    08/12/2019  Imm Admin: Influenza, quadrivalent, preservative-free    08/20/2018  Imm Admin: Influenza, quadrivalent, preservative-free    07/29/2017  Imm Admin: Influenza, unspecified              Depression Screening (Yearly) Next due on 09/17/2023      09/16/2022  SmartData: DEPRESSION SCREENING              Immunization: DTaP/Tdap/Td (3 - Td or Tdap) Next due on 07/10/2026      07/10/2016  Imm Admin: tdap    08/17/2013  Imm Admin: tdap              Immunization: Pneumococcal (Series Information) Aged Out      No completion, postpone, frequency change, or communication history exists for this topic.                  UTD pap per pt .   Obtain records.  Encouraged continued healthy lifestyle efforts  Check labs as ordered  Will get flu vaccine at a later time, it usually causes SE

## 2022-09-29 ENCOUNTER — Other Ambulatory Visit: Admit: 2022-09-29 | Payer: PRIVATE HEALTH INSURANCE

## 2022-09-29 DIAGNOSIS — Z0000 Encounter for general adult medical examination without abnormal findings: Secondary | ICD-10-CM

## 2022-09-29 LAB — LIPID PANEL
Cholesterol, Total: 185 mg/dL (ref 0–200)
HDL: 55 mg/dL — ABNORMAL LOW (ref 60–92)
LDL Cholesterol: 105 mg/dL
Non-HDL Cholesterol, Calculated: 130 mg/dL — ABNORMAL HIGH (ref 0–129)
Triglycerides: 123 mg/dL (ref 10–149)

## 2022-09-29 LAB — CBC
Hematocrit: 40.8 % (ref 35.0–45.0)
Hemoglobin: 13.7 g/dL (ref 11.7–15.5)
MCH: 31.8 pg (ref 27.0–33.0)
MCHC: 33.6 g/dL (ref 32.0–36.0)
MCV: 94.7 fL (ref 80.0–100.0)
MPV: 9.5 fL (ref 7.5–11.5)
Platelets: 225 10*3/uL (ref 140–400)
RBC: 4.31 10*6/uL (ref 3.80–5.10)
RDW: 13.1 % (ref 11.0–15.0)
WBC: 5.4 10*3/uL (ref 3.8–10.8)

## 2022-09-29 LAB — DIFFERENTIAL
Basophils Absolute: 38 /uL (ref 0–200)
Basophils Relative: 0.7 % (ref 0.0–1.0)
Eosinophils Absolute: 81 /uL (ref 15–500)
Eosinophils Relative: 1.5 % (ref 0.0–8.0)
Lymphocytes Absolute: 1193 /uL (ref 850–3900)
Lymphocytes Relative: 22.1 % (ref 15.0–45.0)
Monocytes Absolute: 502 /uL (ref 200–950)
Monocytes Relative: 9.3 % (ref 0.0–12.0)
Neutrophils Absolute: 3586 /uL (ref 1500–7800)
Neutrophils Relative: 66.4 % (ref 40.0–80.0)
nRBC: 0 /100 WBC (ref 0–0)

## 2022-09-29 LAB — COMPREHENSIVE METABOLIC PANEL
ALT: 18 U/L (ref 7–52)
AST: 17 U/L (ref 13–39)
Albumin: 4.1 g/dL (ref 3.5–5.7)
Alkaline Phosphatase: 57 U/L (ref 36–125)
Anion Gap: 9 mmol/L (ref 3–16)
BUN: 17 mg/dL (ref 7–25)
CO2: 27 mmol/L (ref 21–33)
Calcium: 9 mg/dL (ref 8.6–10.3)
Chloride: 106 mmol/L (ref 98–110)
Creatinine: 0.73 mg/dL (ref 0.60–1.30)
EGFR: >90
Glucose: 83 mg/dL (ref 70–100)
Osmolality, Calculated: 295 mOsm/kg (ref 278–305)
Potassium: 4.9 mmol/L (ref 3.5–5.3)
Sodium: 142 mmol/L (ref 133–146)
Total Bilirubin: 0.4 mg/dL (ref 0.0–1.5)
Total Protein: 6.7 g/dL (ref 6.4–8.9)

## 2022-09-29 LAB — HEMOGLOBIN A1C: Hemoglobin A1C: 5.1 % (ref 4.0–5.6)

## 2022-09-29 LAB — TSH: TSH: 1.91 u[IU]/mL (ref 0.45–4.12)

## 2022-09-29 LAB — T4, FREE: Free T4: 0.92 ng/dL (ref 0.61–1.76)

## 2022-10-27 ENCOUNTER — Ambulatory Visit: Admit: 2022-10-27 | Payer: PRIVATE HEALTH INSURANCE

## 2022-10-27 ENCOUNTER — Other Ambulatory Visit: Admit: 2022-10-27 | Payer: PRIVATE HEALTH INSURANCE

## 2022-10-27 DIAGNOSIS — K5909 Other constipation: Secondary | ICD-10-CM

## 2022-10-27 LAB — TISSUE TRANSGLUTAMINASE, IGA: Transglutaminase IgA: <2 U/mL (ref 0–3)

## 2022-10-27 LAB — IGA: IgA: 222 mg/dL (ref 70.0–400.0)

## 2022-10-27 MED ORDER — lubiprostone (AMITIZA) 8 MCG capsule
8 | ORAL_CAPSULE | Freq: Two times a day (BID) | ORAL | 2 refills | Status: AC
Start: 2022-10-27 — End: ?

## 2022-10-27 NOTE — Unmapped (Signed)
[facility]  [place] [facility] AT [facility] OFFICE  [address]  [place] [address]  Subjective   Name:  Jasmine Fritz Date of Birth: 05-22-1981 (41 y.o.)   MRN: [id number]    Date of Service:  10/27/2022      Chief Complaint   Patient presents with    Medication Refill     Follow up     History of Present Illness:  Jasmine Fritz is a(n) 41 y.o. female here today for the following:   HPI    Chronic constipation- tried linzess and had liquid stools. Reports that she gets bloated every time she eats. It doesn't matter what she choose to eat. Has tried cutting down on carbs, just only meats that also does it. So do veggies.       Current Outpatient Medications   Medication Sig Dispense Refill    lubiprostone (AMITIZA) 8 MCG capsule Take 1 capsule (8 mcg total) by mouth 2 times a day with meals. 60 capsule 2     No current facility-administered medications for this visit.      Review of Systems        Objective   Vitals:    10/27/22 1508   BP: 104/67   Pulse: 89   SpO2: 96%   Weight: 158 lb 6.4 oz (71.8 kg)     Body mass index is 28.06 kg/m.        Physical Exam  Vitals and nursing note reviewed.   Constitutional:       Appearance: She is well-developed.   HENT:      Head: Normocephalic and atraumatic.      Right Ear: External ear normal.      Left Ear: External ear normal.   Eyes:      General: No scleral icterus.     Conjunctiva/sclera: Conjunctivae normal.   Cardiovascular:      Rate and Rhythm: Normal rate and regular rhythm.      Heart sounds: Normal heart sounds.   Pulmonary:      Effort: Pulmonary effort is normal.      Breath sounds: Normal breath sounds.   Abdominal:      General: There is no distension.      Tenderness: There is no abdominal tenderness. There is no guarding or rebound.   Musculoskeletal:      Cervical back: Neck supple.   Lymphadenopathy:      Cervical: No cervical adenopathy.   Skin:     General: Skin is warm and dry.      Findings: No rash.   Neurological:      Mental  Status: She is alert and oriented to person, place, and time.      Motor: No abnormal muscle tone.   Psychiatric:         Behavior: Behavior normal.                  Assessment & Plan     Tanita was seen today for medication refill.    Diagnoses and all orders for this visit:    Chronic constipation (Primary)  Assessment & Plan:  Had SE with linzess  Will trial amitiza  Check celiac panel  Refer to GI if still not improving    Orders:  -     Tissue transglutaminase, IgA; Future  -     IgA; Future  -     lubiprostone (AMITIZA) 8 MCG capsule; Take 1 capsule (8 mcg  total) by mouth 2 times a day with meals.                                Jasmine Solum Salley SlaughterJANE Benjerman Molinelli, MD

## 2022-10-27 NOTE — Unmapped (Signed)
Had SE with linzess  Will trial amitiza  Check celiac panel  Refer to GI if still not improving

## 2023-04-21 ENCOUNTER — Ambulatory Visit: Admit: 2023-04-21 | Discharge: 2023-04-21 | Payer: PRIVATE HEALTH INSURANCE

## 2023-04-21 DIAGNOSIS — D229 Melanocytic nevi, unspecified: Secondary | ICD-10-CM

## 2023-04-21 NOTE — Progress Notes (Signed)
Dermatology Note      Jasmine Fritz is a 42 y.o. female [name]    CC:   Chief Complaint   Patient presents with    Skin Exam     Pt here for fse has hx of melanoma on R FA in 2019        HPI:    1.) Many yr hx of the following:  - Unchanging pigmented lesions face, trunk and extremities.  - Red lesions trunk and extremities.    2.) Hx MIS R forearm s/p WLE with Dr. Choucair Thomasene Ripple9.      ADDITIONAL HISTORY:    Hx MIS R forearm s/p WLE with Dr. Thomasene Ripple 04/2018.    Personal hx NMSC ( - )  Personal hx MM ( + )  Family hx skin cancer ( - )    Occupation: Teacher at [employer].      ROS:     General: Feels well, no fevers or chills.  Skin: + skin lesions.      PHYSICAL EXAM:    Examination was performed of the following: full skin    Abnormalities noted include:    1.) Scattered on the face, trunk and extremities are many well defined light to dark brown, red macules and papules.    2.) Well healed scar R forearm.  No cervical or axillary LAD.      ASSESSMENT AND PLAN:    1.) Banal Melanocytic Nevi, Lentigines, Angiomas, multiple, trunk and extremities  - Counseled regarding benign nature.  - Recommend observation at this time.   - Photoprotection discussed; OTC broad spectrum SPF 30+ sunscreen recommended.    2.) Hx MIS, R forearm  - No signs of recurrence.  - No new concerning lesions.  - FBSE in 6 mos.        Follow-up: 6 mos      [facility], MD  04/21/2023      I have reviewed the entire note today and made edits to each section as needed 04/21/2023

## 2023-05-06 ENCOUNTER — Ambulatory Visit: Admit: 2023-05-06 | Payer: PRIVATE HEALTH INSURANCE

## 2023-05-06 DIAGNOSIS — L03113 Cellulitis of right upper limb: Secondary | ICD-10-CM

## 2023-05-06 MED ORDER — triamcinolone (KENALOG) 0.1 % cream
0.1 | Freq: Two times a day (BID) | TOPICAL | 0 refills | 1.00000 days | Status: AC | PRN
Start: 2023-05-06 — End: ?

## 2023-05-06 MED ORDER — doxycycline (VIBRAMYCIN) 100 MG capsule
100 | ORAL_CAPSULE | Freq: Two times a day (BID) | ORAL | 0 refills | Status: AC
Start: 2023-05-06 — End: 2023-05-13

## 2023-05-06 NOTE — Progress Notes (Addendum)
[facility]  [place] [facility] AT [facility] OFFICE  [address]  [place] [address]  Subjective   Name:  Jasmine Fritz Date of Birth: [birth year] (42 y.o.)   MRN: [id number]    Date of Service:  05/06/2023      Chief Complaint   Patient presents with    Insect Bite     infected     History of Present Illness:  Jasmine Fritz is a(n) 42 y.o. female here today for the following:   HPI    Bit by insect, inc from 3in to 4inches  Maybe Friday/saturday  Seen at urgent care on Sunday  Was itchy  No fevers no chills   Taking cephalex  2.5% coritozone with out much help      Current Outpatient Medications   Medication Sig Dispense Refill    cephALEXin (KEFLEX) 500 MG capsule Take 1 capsule (500 mg total) by mouth 4 times daily before meals and at bedtime.      hydrocortisone 1 % cream Apply 2 g topically 2 times a day. Apply to bug bite      doxycycline (VIBRAMYCIN) 100 MG capsule Take 1 capsule (100 mg total) by mouth 2 times a day for 7 days. 14 capsule 0    triamcinolone (KENALOG) 0.1 % cream Apply topically 2 times a day as needed. 30 g 0     No current facility-administered medications for this visit.      Review of Systems        Objective   Vitals:    05/06/23 1612   BP: 116/70   Pulse: 68   Temp: 98.2 F (36.8 C)   SpO2: 98%   Weight: 154 lb 6.4 oz (70 kg)     Body mass index is 27.35 kg/m.        Physical Exam  Vitals and nursing note reviewed.   Constitutional:       Appearance: She is well-developed.   HENT:      Head: Normocephalic and atraumatic.      Right Ear: External ear normal.      Left Ear: External ear normal.   Eyes:      General: No scleral icterus.     Conjunctiva/sclera: Conjunctivae normal.   Pulmonary:      Effort: Pulmonary effort is normal. No respiratory distress.   Skin:     General: Skin is warm and dry.      Comments: Red rash, about 4-5cm in size  Redness extending initial marking   Neurological:      Mental Status: She is alert and oriented to person, place, and time.       Motor: No abnormal muscle tone.   Psychiatric:         Behavior: Behavior normal.                  Assessment & Plan     Jasmine Fritz was seen today for insect bite.    Diagnoses and all orders for this visit:    Cellulitis of right upper extremity (Primary)    Other orders  -     doxycycline (VIBRAMYCIN) 100 MG capsule; Take 1 capsule (100 mg total) by mouth 2 times a day for 7 days.  -     triamcinolone (KENALOG) 0.1 % cream; Apply topically 2 times a day as needed.          Progressing rash from bug bite  Broaden coverage with doxycycline and  topical triamcinolone  Can trial 5mg  claritn for itching if not making her too tired    BMI is normal.                      [name] MARGARET Johna Sheriff, MD

## 2023-06-16 NOTE — Progress Notes (Signed)
Pt hasn't been seen for physical since 09/16/22. Okay to fill form out? Forms placed on Dr Shela Commons desk

## 2023-06-16 NOTE — Progress Notes (Unsigned)
Forms on Dr Shela Commons desk

## 2023-06-16 NOTE — Progress Notes (Unsigned)
completed

## 2023-06-17 NOTE — Progress Notes (Signed)
Please send to pt via my chart

## 2023-10-05 LAB — MAMMOGRAM (SCAN)

## 2023-10-06 ENCOUNTER — Other Ambulatory Visit: Admit: 2023-10-06 | Payer: PRIVATE HEALTH INSURANCE

## 2023-10-06 ENCOUNTER — Ambulatory Visit: Admit: 2023-10-06 | Discharge: 2023-10-06 | Payer: PRIVATE HEALTH INSURANCE

## 2023-10-06 DIAGNOSIS — Z0000 Encounter for general adult medical examination without abnormal findings: Secondary | ICD-10-CM

## 2023-10-06 LAB — COMPREHENSIVE METABOLIC PANEL
ALT: 11 U/L (ref 7–52)
AST: 17 U/L (ref 13–39)
Albumin: 4.1 g/dL (ref 3.5–5.7)
Alkaline Phosphatase: 52 U/L (ref 36–125)
Anion Gap: 8 mmol/L (ref 3–16)
BUN: 13 mg/dL (ref 7–25)
CO2: 28 mmol/L (ref 21–33)
Calcium: 9 mg/dL (ref 8.6–10.3)
Chloride: 105 mmol/L (ref 98–110)
Creatinine: 0.76 mg/dL (ref 0.60–1.30)
EGFR: >90
Glucose: 82 mg/dL (ref 70–100)
Osmolality, Calculated: 291 mosm/kg (ref 278–305)
Potassium: 4.7 mmol/L (ref 3.5–5.3)
Sodium: 141 mmol/L (ref 133–146)
Total Bilirubin: 0.6 mg/dL (ref 0.0–1.5)
Total Protein: 6.9 g/dL (ref 6.4–8.9)

## 2023-10-06 LAB — LIPID PANEL
Cholesterol, Total: 186 mg/dL (ref 0–200)
HDL: 59 mg/dL — ABNORMAL LOW (ref 60–92)
LDL Cholesterol: 112 mg/dL
Non-HDL Cholesterol, Calculated: 127 mg/dL (ref 0–129)
Triglycerides: 77 mg/dL (ref 10–149)

## 2023-10-06 LAB — THYROID FUNCTION CASCADE: TSH: 1.89 u[IU]/mL (ref 0.45–4.12)

## 2023-10-06 LAB — HEMOGLOBIN A1C: Hemoglobin A1C: 5.2 % (ref 4.0–5.6)

## 2023-10-06 NOTE — Assessment & Plan Note (Addendum)
 Health Maintenance Summary       Overdue - Hepatitis C Screening (MyChart) (Once) Never done      No completion, postpone, frequency change, or communication history exists for this topic.              Overdue - HIV Screening (Once) Never done      No comp

## 2023-10-06 NOTE — Assessment & Plan Note (Signed)
?  migraine  Intermittent  Resolve with caffeine and NSAIDs  Monitor for increased frequency or severity.

## 2023-10-06 NOTE — Progress Notes (Signed)
 [facility]  [place] [facility] AT [facility] OFFICE  [address]  [place] [address]  Subjective   Name:  Jasmine Fritz Date of Birth: [birth year] (42 y.o.)   MRN: [id number]    Date of Service:  10/06/2023      Chief Complaint   Patient

## 2023-10-26 ENCOUNTER — Ambulatory Visit: Payer: PRIVATE HEALTH INSURANCE

## 2023-10-26 NOTE — Progress Notes (Unsigned)
Dermatology Note      Jasmine Fritz is a 42 y.o. female, LV 04/2023.    CC:   No chief complaint on file.      HPI:    1.) Many yr hx of the following:  - Unchanging pigmented lesions face, trunk and extremities.  - Red lesions trunk and extremities.    2.) Hx MIS R forearm s/p WLE with Dr. Thomasene Ripple 04/2018.      ADDITIONAL HISTORY:    Hx MIS R forearm s/p WLE with Dr. Thomasene Ripple 04/2018.    Personal hx NMSC ( - )  Personal hx MM ( + )  Family hx skin cancer ( - )    Occupation: Runner, broadcasting/film/video at [employer].      ROS:     General: Feels well, no fevers or chills.  Skin: + skin lesions.      PHYSICAL EXAM:    Examination was performed of the following: full skin    Abnormalities noted include:    1.) Scattered on the face, trunk and extremities are many well defined light to dark brown, red macules and papules.    2.) Well healed scar R forearm.  No cervical or axillary LAD.      ASSESSMENT AND PLAN:    1.) Banal Melanocytic Nevi, Lentigines, Angiomas, multiple, trunk and extremities  - Counseled regarding benign nature.  - Recommend observation at this time.   - Photoprotection discussed; OTC broad spectrum SPF 30+ sunscreen recommended.    2.) Hx MIS, R forearm  - No signs of recurrence.  - No new concerning lesions.  - FBSE in 6 mos.        Follow-up: 6 mos      [facility], MD  10/26/2023      I have reviewed the entire note today and made edits to each section as needed 10/26/2023

## 2023-12-17 ENCOUNTER — Ambulatory Visit: Admit: 2023-12-17 | Discharge: 2023-12-21 | Payer: PRIVATE HEALTH INSURANCE

## 2023-12-17 ENCOUNTER — Ambulatory Visit: Admit: 2023-12-18 | Payer: PRIVATE HEALTH INSURANCE

## 2023-12-17 DIAGNOSIS — R6889 Other general symptoms and signs: Secondary | ICD-10-CM

## 2023-12-17 LAB — POCT INFLUENZA A/B
Rapid Influenza A Ag: NEGATIVE
Rapid Influenza B Ag: NEGATIVE

## 2023-12-17 LAB — INFLUENZA A AND B, COVID, RSV COMBINATION ASSAY, NAA
Influenza A: POSITIVE — AB
Influenza B: NEGATIVE
RSV: NEGATIVE
SARS-CoV-2: NEGATIVE

## 2023-12-17 MED ORDER — oseltamivir (TAMIFLU) 75 MG capsule
75 | ORAL_CAPSULE | Freq: Two times a day (BID) | ORAL | 0 refills | Status: AC
Start: 2023-12-17 — End: ?

## 2023-12-17 NOTE — Progress Notes (Signed)
 [facility]  [place] [facility] AT [facility] OFFICE  [address]  [place] [address]  Subjective   Name:  Jasmine Fritz Date of Birth: December 16, 1980 (43 y.o.)   MRN: [id number]    Date of Service:  12/17/2023      Chief Complaint   Patient presents with    Fever    Headache    Cough    Generalized Body Aches     History of Present Illness:  Jasmine Fritz is a(n) 43 y.o. female here today for the following:   HPI    Fever cough body aches   Very flu like  Sent home yesterday  Aches, pain in chest  100-102    Tylenol helping      Current Outpatient Medications   Medication Sig Dispense Refill    oseltamivir  (TAMIFLU ) 75 MG capsule Take 1 capsule (75 mg total) by mouth 2 times a day. 10 capsule 0     No current facility-administered medications for this visit.      Review of Systems        Objective   Vitals:    12/17/23 1642   BP: 109/77   Pulse: 84   Temp: 99.9 F (37.7 C)   SpO2: 95%   Weight: 152 lb 12.8 oz (69.3 kg)     Body mass index is 27.07 kg/m.        Physical Exam  Vitals reviewed.   Constitutional:       Appearance: She is well-developed.   HENT:      Right Ear: Tympanic membrane normal.      Left Ear: Tympanic membrane normal.      Nose: Congestion present.      Mouth/Throat:      Pharynx: No oropharyngeal exudate.   Eyes:      General:         Right eye: No discharge.         Left eye: No discharge.      Conjunctiva/sclera: Conjunctivae normal.   Cardiovascular:      Rate and Rhythm: Normal rate and regular rhythm.      Heart sounds: No murmur heard.  Pulmonary:      Effort: Pulmonary effort is normal. No respiratory distress.      Breath sounds: No wheezing or rales.   Abdominal:      General: Bowel sounds are normal. There is no distension.      Palpations: Abdomen is soft.   Musculoskeletal:      Cervical back: Normal range of motion and neck supple.   Lymphadenopathy:      Cervical: No cervical adenopathy.   Skin:     General: Skin is warm.      Findings: No rash.                   Assessment & Plan     Tyrika was seen today for fever, headache, cough and generalized body aches.    Diagnoses and all orders for this visit:    Flu-like symptoms (Primary)  -     oseltamivir  (TAMIFLU ) 75 MG capsule; Take 1 capsule (75 mg total) by mouth 2 times a day.  -     Influenza A and B, COVID, RSV Combination Assay, NAA; Future    Fever, unspecified fever cause  -     POCT Influenza A/B  -     oseltamivir  (TAMIFLU ) 75 MG capsule; Take 1 capsule (75 mg total)  by mouth 2 times a day.    Cough, unspecified type  -     oseltamivir  (TAMIFLU ) 75 MG capsule; Take 1 capsule (75 mg total) by mouth 2 times a day.        Sounds very flu like  Will start tamiflu , continue if pcr positive    BMI is normal.                      Daniqua Campoy MARGARET LORIS KANNER, MD

## 2024-02-08 NOTE — Other (Signed)
 This is a notification of a Admission Alert generated from an ADT received from [employer]. This patient was admited SE:[facility] Admit [phone] Discharge Date: Visit Type:Emergency Diagnosis:

## 2024-02-08 NOTE — Other (Signed)
 This is a notification of a Discharge Alert generated from an ADT received from [employer]. This patient was admited [id number] Admit [phone] Discharge [phone] Visit Type:Emergency Diagnosis:Excessive and frequent   menstruation with regular cycle Urinary tract infection, site not specified Hematuria, unspecified

## 2024-03-28 NOTE — Progress Notes (Signed)
 Printed forms  prepped and placed in Dr. Howard Macho bin to sing

## 2024-03-29 NOTE — Progress Notes (Signed)
 Please scan into pt chart and send VIA mychart

## 2024-05-03 ENCOUNTER — Ambulatory Visit: Payer: PRIVATE HEALTH INSURANCE

## 2024-08-24 NOTE — Telephone Encounter (Signed)
 Patient is scheduled for an upcoming visit on 12/08/2024 Jasmine JINNY Cedar, MD and is requesting lab orders to be placed ahead of the appointment. Patient has requested Annual Labs to be ordered.     Last Visit: 12/17/2023 Jasmine Rollene Loris Sanjuan, MD     Advise Patient: A message is being sent to your PCP, you should receive a notification within 72 business hours.     Patient would like to be notified via [employer]

## 2024-08-25 NOTE — Telephone Encounter (Signed)
 NOTIFIED PATIENT

## 2024-08-25 NOTE — Telephone Encounter (Signed)
Labs ordered.  Should go fasting.

## 2024-10-03 ENCOUNTER — Other Ambulatory Visit: Admit: 2024-10-03 | Payer: PRIVATE HEALTH INSURANCE

## 2024-10-03 DIAGNOSIS — Z0000 Encounter for general adult medical examination without abnormal findings: Principal | ICD-10-CM

## 2024-10-03 LAB — DIFFERENTIAL
Basophils Absolute: 28 /uL (ref 0–108)
Basophils Relative: 0.6 % (ref 0.0–1.0)
Eosinophils Absolute: 37 /uL (ref 0–864)
Eosinophils Relative: 0.8 % (ref 0.0–8.0)
Lymphocytes Absolute: 1109 /uL (ref 570–4860)
Lymphocytes Relative: 24.1 % (ref 15.0–45.0)
Monocytes Absolute: 340 /uL (ref 0–1296)
Monocytes Relative: 7.4 % (ref 0.0–12.0)
Neutrophils Absolute: 3087 /uL (ref 1520–8640)
Neutrophils Relative: 67.1 % (ref 40.0–80.0)
nRBC: 0 /100{WBCs} (ref 0–0)

## 2024-10-03 LAB — COMPREHENSIVE METABOLIC PANEL
ALT: 12 U/L (ref 7–52)
AST: 16 U/L (ref 13–39)
Albumin: 4.3 g/dL (ref 3.5–5.7)
Alkaline Phosphatase: 52 U/L (ref 36–125)
Anion Gap: 8 mmol/L (ref 3–16)
BUN: 8 mg/dL (ref 7–25)
CO2: 25 mmol/L (ref 21–33)
Calcium: 8.9 mg/dL (ref 8.6–10.3)
Chloride: 106 mmol/L (ref 98–110)
Creatinine: 0.98 mg/dL (ref 0.60–1.30)
EGFR: 74
Glucose: 83 mg/dL (ref 70–100)
Osmolality, Calculated: 285 mosm/kg (ref 278–305)
Potassium: 4 mmol/L (ref 3.5–5.3)
Sodium: 139 mmol/L (ref 133–146)
Total Bilirubin: 0.4 mg/dL (ref 0.0–1.5)
Total Protein: 6.7 g/dL (ref 6.4–8.9)

## 2024-10-03 LAB — CBC
Hematocrit: 41.7 % (ref 35.0–45.0)
Hemoglobin: 14.3 g/dL (ref 11.7–15.5)
MCH: 32.3 pg (ref 27.0–33.0)
MCHC: 34.3 g/dL (ref 32.0–36.0)
MCV: 94.2 fL (ref 80.0–100.0)
MPV: 8.4 fL (ref 7.5–11.5)
Platelets: 271 10E3/uL (ref 140–400)
RBC: 4.42 10E6/uL (ref 3.80–5.10)
RDW: 13.3 % (ref 11.0–15.0)
WBC: 4.6 10E3/uL (ref 3.8–10.8)

## 2024-10-03 LAB — LIPID PANEL
Cholesterol, Total: 190 mg/dL (ref 0–200)
HDL: 63 mg/dL (ref 60–92)
LDL Cholesterol: 111 mg/dL
Non-HDL Cholesterol, Calculated: 127 mg/dL (ref 0–129)
Triglycerides: 78 mg/dL (ref 10–149)

## 2024-10-03 LAB — HEMOGLOBIN A1C: Hemoglobin A1C: 5.1 % (ref 4.0–5.6)

## 2024-10-12 ENCOUNTER — Ambulatory Visit: Payer: PRIVATE HEALTH INSURANCE

## 2024-10-28 MED ORDER — LINACLOTIDE 145 MCG CAPSULE
145 | ORAL_CAPSULE | Freq: Every day | ORAL | 5 refills | 30.00000 days | Status: AC
Start: 2024-10-28 — End: ?

## 2024-12-08 ENCOUNTER — Ambulatory Visit: Payer: PRIVATE HEALTH INSURANCE
